# Patient Record
Sex: Male | Born: 2000 | Race: Black or African American | Hispanic: No | Marital: Single | Smoking: Never smoker
Health system: Southern US, Community
[De-identification: ages and names within clinical notes are randomized; demographics above are authoritative.]

## PROBLEM LIST (undated history)

## (undated) HISTORY — PX: TONSILLECTOMY: SUR1361

## (undated) HISTORY — PX: TONSILLECTOMY AND ADENOIDECTOMY: SUR1326

---
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
## 2023-01-02 IMAGING — DX DG HIP (WITH OR WITHOUT PELVIS) 2-3V*R*
3 series · 3 of 3 positions shown · non-contrast
Comparison: None.

CLINICAL DATA: 20-year-old male status post fall.

EXAM:
DG HIP (WITH OR WITHOUT PELVIS) 2-3V RIGHT

[pelvis ap]
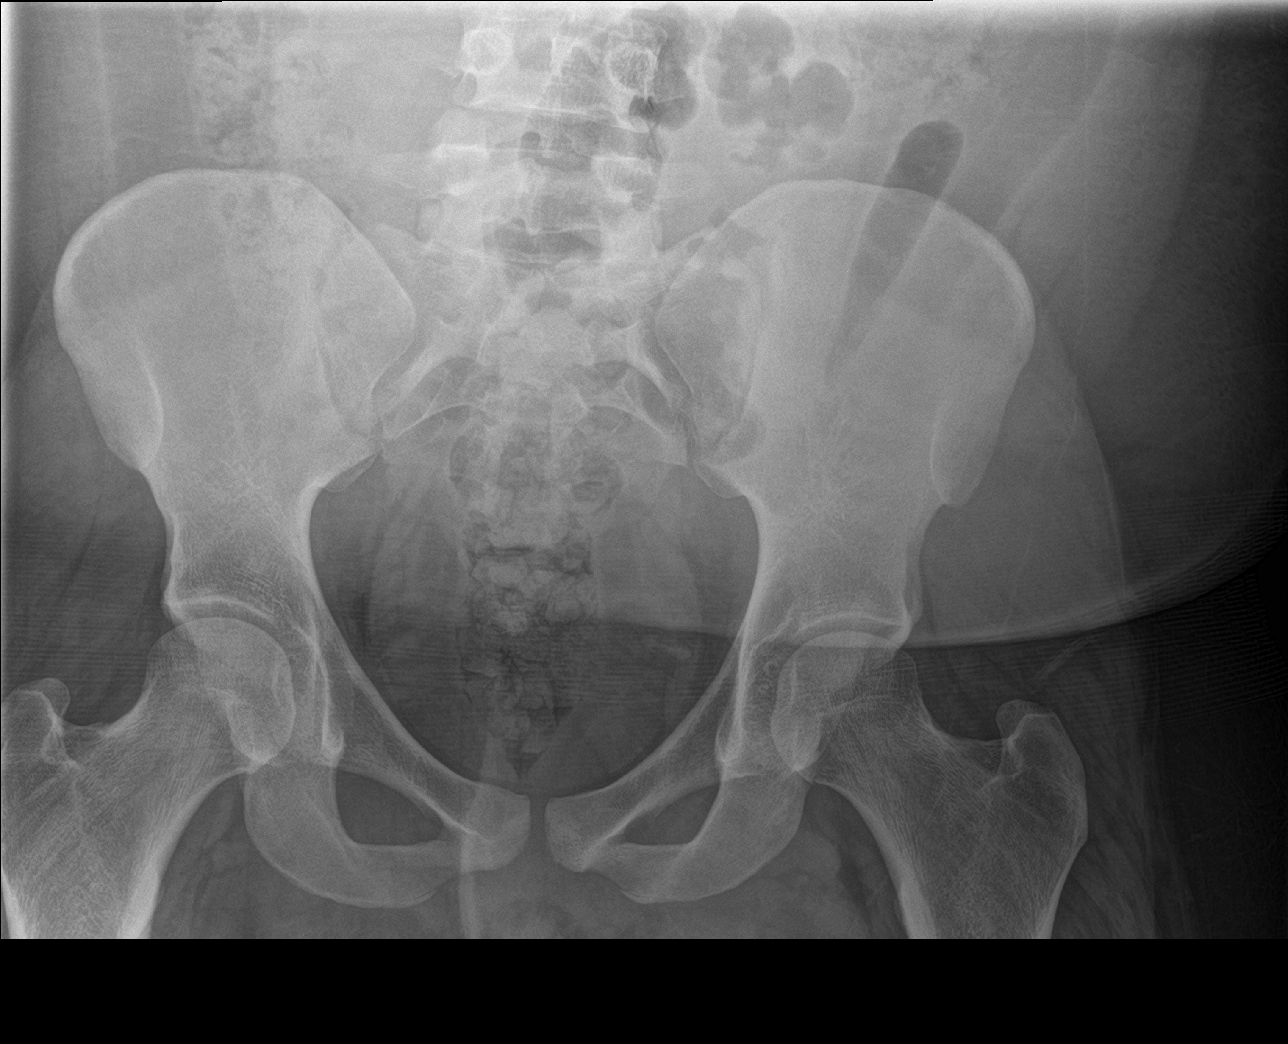

[hip ap]
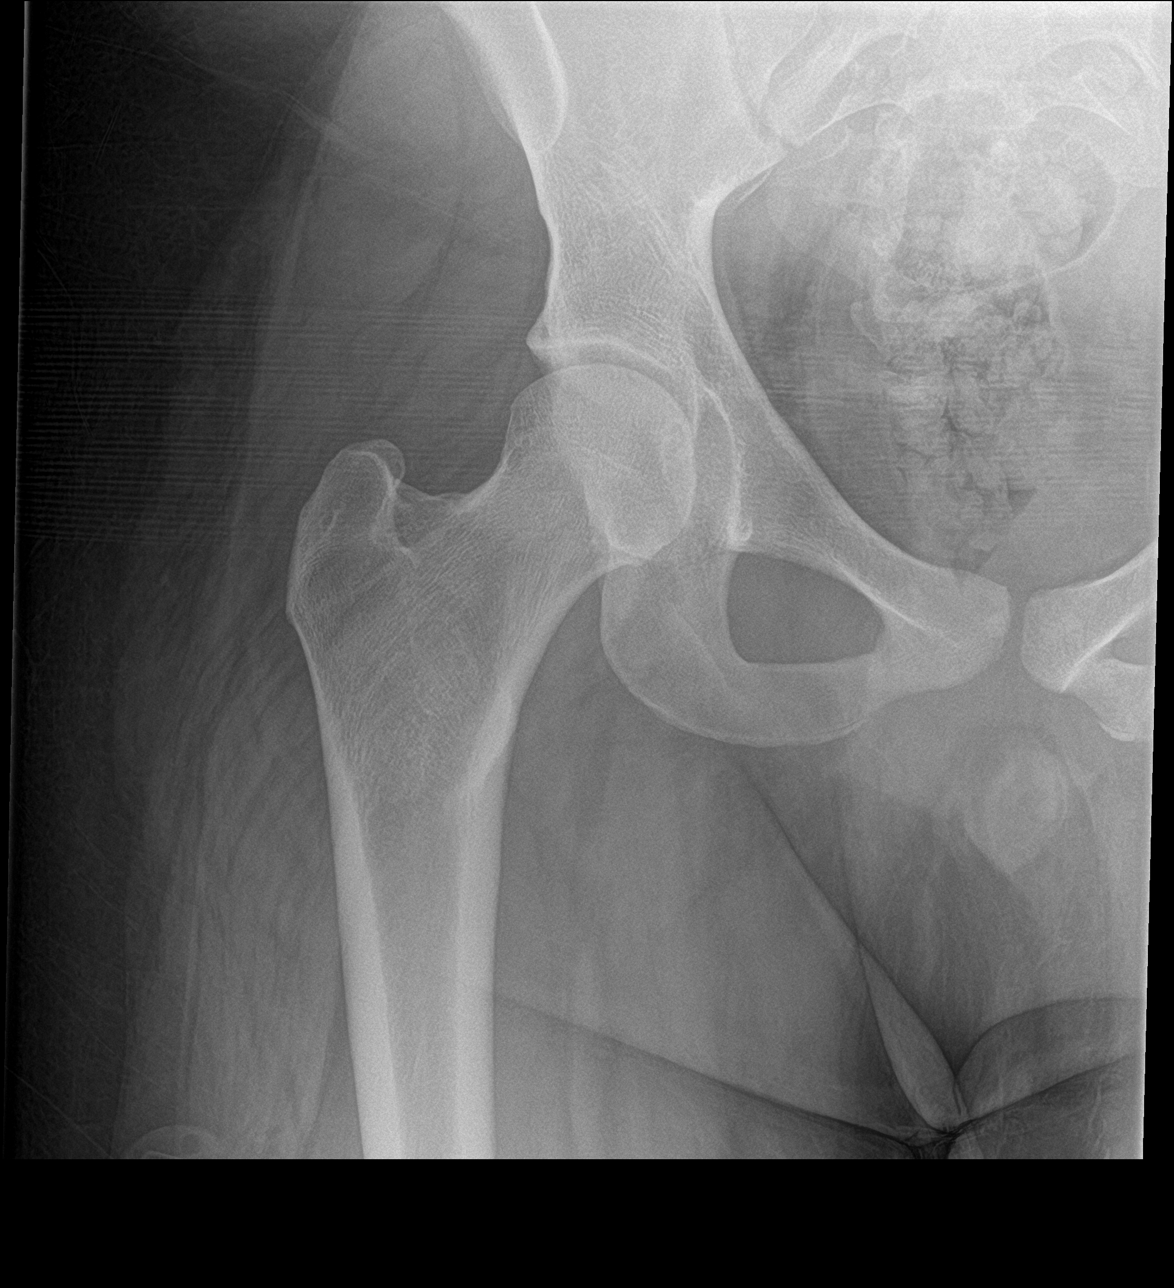

[hip lat]
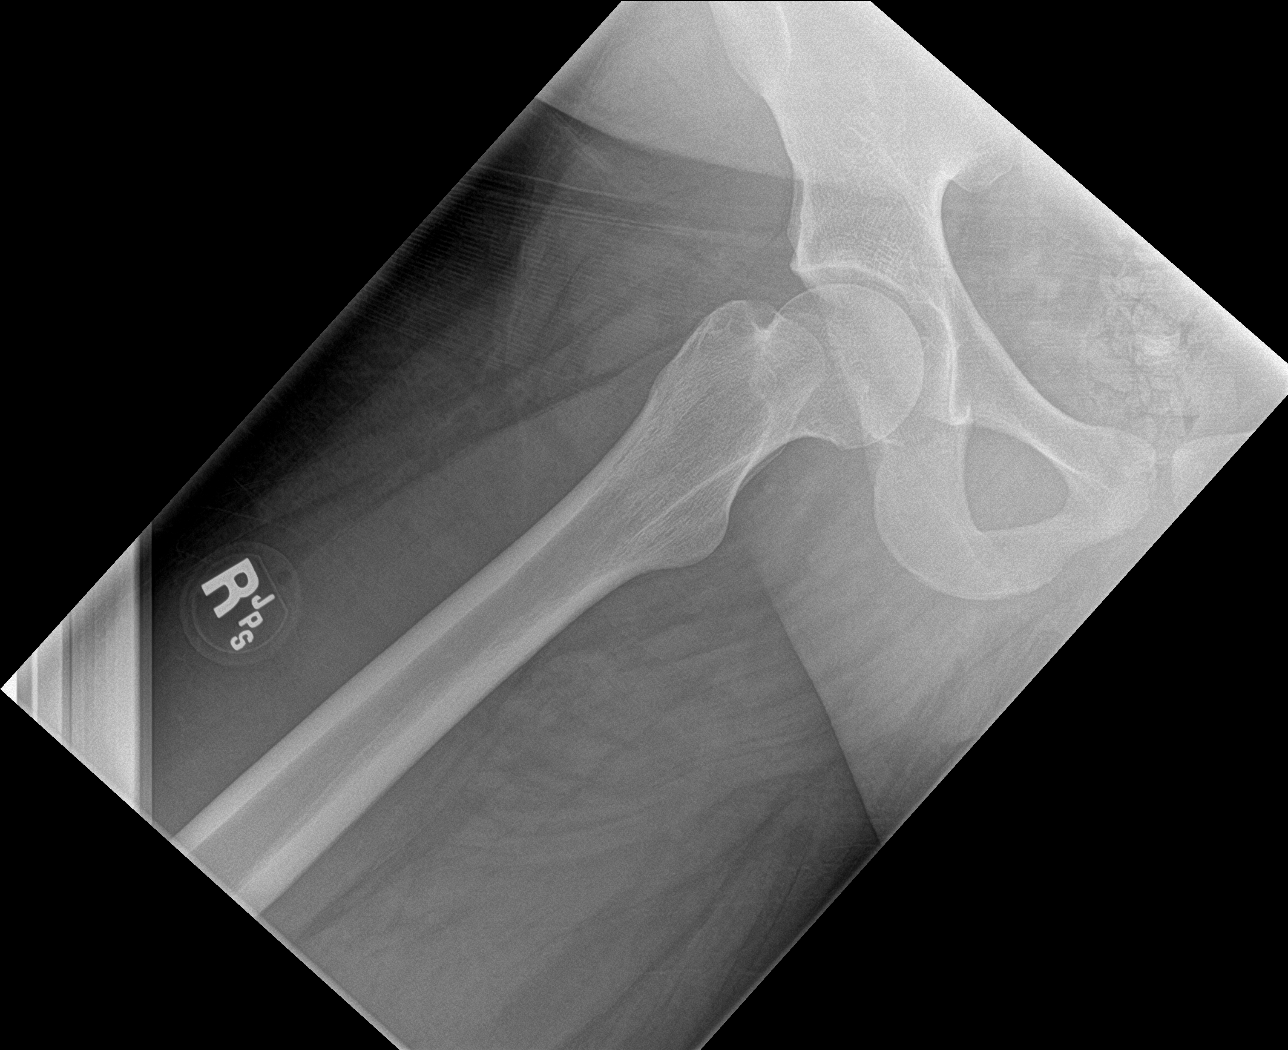

[3 of 3 positions shown; findings below may reference images not displayed]

FINDINGS: There is no evidence of hip fracture or dislocation. There is no
evidence of arthropathy or other focal bone abnormality.
IMPRESSION: No acute fracture or malalignment.
